# Patient Record
Sex: Male | Born: 1990 | Race: Black or African American | Hispanic: No | Marital: Single | Smoking: Current every day smoker
Health system: Southern US, Community
[De-identification: ages and names within clinical notes are randomized; demographics above are authoritative.]

---
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
## 2013-04-05 ENCOUNTER — Emergency Department (INDEPENDENT_AMBULATORY_CARE_PROVIDER_SITE_OTHER)
Admission: EM | Admit: 2013-04-05 | Discharge: 2013-04-05 | Disposition: A | Discharge status: Home / Self Care | Source: Home / Self Care | Attending: Family Medicine | Admitting: Family Medicine

## 2013-04-05 ENCOUNTER — Encounter (HOSPITAL_COMMUNITY): Payer: Self-pay | Admitting: Emergency Medicine

## 2013-04-05 DIAGNOSIS — B9789 Other viral agents as the cause of diseases classified elsewhere: Principal | ICD-10-CM

## 2013-04-05 DIAGNOSIS — J069 Acute upper respiratory infection, unspecified: Secondary | ICD-10-CM

## 2013-04-05 MED ORDER — GUAIFENESIN-CODEINE 100-10 MG/5ML PO SOLN: 5.0000 mL | Freq: Every evening | ORAL | Status: DC | PRN

## 2013-04-05 MED ORDER — IPRATROPIUM BROMIDE 0.06 % NA SOLN: 2.0000 | Freq: Four times a day (QID) | NASAL | Status: DC

## 2013-04-05 NOTE — ED Provider Notes (Signed)
Clyde CanterburyBrandon L Mackert is a 23 y.o. male who presents to Urgent Care today for 3 days of cough fever body aches and nasal congestion and discharge. Patient has tried multiple over-the-counter medications which helped. No shortness of breath nausea vomiting or diarrhea. No significant sick contacts. Patient feels well otherwise.   No past medical history on file. History  Substance Use Topics  . Smoking status: Current Every Day Smoker -- 0.10 packs/day    Types: Cigarettes  . Smokeless tobacco: Not on file  . Alcohol Use: No   ROS as above Medications reviewed. No current facility-administered medications for this encounter.   Current Outpatient Prescriptions  Medication Sig Dispense Refill  . ibuprofen (ADVIL,MOTRIN) 200 MG tablet Take 600 mg by mouth every 6 (six) hours as needed for fever.      Marland Kitchen. guaiFENesin-codeine 100-10 MG/5ML syrup Take 5 mLs by mouth at bedtime as needed for cough.  120 mL  0  . ipratropium (ATROVENT) 0.06 % nasal spray Place 2 sprays into both nostrils 4 (four) times daily.  15 mL  1    Exam:  BP 125/78  Pulse 96  Temp(Src) 99.3 F (37.4 C) (Oral)  Resp 18  SpO2 97% Gen: Well NAD HEENT: EOMI,  MMM posterior pharynx mild cobblestoning. Tympanic membranes are normal appearing bilaterally Lungs: Normal work of breathing. CTABL Heart: RRR no MRG Abd: NABS, Soft. NT, ND Exts: Non edematous BL  LE, warm and well perfused.    Assessment and Plan: 23 y.o. male with viral URI with cough versus influenza-like illness . Doubtful for pneumonia given normal oxygen saturation and lung findings on exam.  Plan to treat symptomatically with codeine containing cough medication and Atrovent nasal spray. Followup with primary care provider if not improving. ,Discussed warning signs or symptoms. Please see discharge instructions. Patient expresses understanding.      Rodolph BongEvan S Cosette Prindle, MD 04/05/13 2002

## 2013-04-05 NOTE — Discharge Instructions (Signed)
Thank you for coming in today. Take containing cough medication at bedtime as needed. Use Atrovent nasal spray for running nose. Use 2 Aleve twice daily for pain fevers chills bodyaches. Call or go to the emergency room if you get worse, have trouble breathing, have chest pains, or palpitations.

## 2013-04-05 NOTE — ED Notes (Signed)
C/o cough, runny nose, aching all over and fever onset 4-5 days ago.  Temp 101.5 this AM.

## 2019-02-23 ENCOUNTER — Ambulatory Visit (HOSPITAL_COMMUNITY)
Admission: EM | Admit: 2019-02-23 | Discharge: 2019-02-23 | Disposition: A | Discharge status: Home / Self Care | Payer: Managed Care, Other (non HMO) | Attending: Family Medicine | Admitting: Family Medicine

## 2019-02-23 ENCOUNTER — Encounter (HOSPITAL_COMMUNITY): Payer: Self-pay | Admitting: Emergency Medicine

## 2019-02-23 ENCOUNTER — Other Ambulatory Visit: Payer: Self-pay

## 2019-02-23 DIAGNOSIS — R05 Cough: Secondary | ICD-10-CM | POA: Insufficient documentation

## 2019-02-23 DIAGNOSIS — R059 Cough, unspecified: Secondary | ICD-10-CM

## 2019-02-23 DIAGNOSIS — Z20822 Contact with and (suspected) exposure to covid-19: Secondary | ICD-10-CM

## 2019-02-23 DIAGNOSIS — Z20828 Contact with and (suspected) exposure to other viral communicable diseases: Secondary | ICD-10-CM | POA: Diagnosis present

## 2019-02-23 NOTE — Discharge Instructions (Signed)
Get plenty of rest Tylenol for pain or fever Over-the-counter cough and cold medicines as needed Your test results are available in MyChart

## 2019-02-23 NOTE — ED Triage Notes (Signed)
Patient started with sore throat, cough, soreness last night.    Patient has a son that was with him for 4 days.  Child is back with mother, but mother has tested positive for covid

## 2019-02-24 NOTE — ED Provider Notes (Signed)
[place]    CSN: [id number] Arrival date & time: 02/23/19  1857      History   Chief Complaint Chief Complaint  Patient presents with  . Cough    HPI DAXTEN KOVALENKO is a 28 y.o. male.   HPI  Patient is here requesting coronavirus testing.  He states that he has a child that lives with his mother, but visits him on weekends.  He saw the child last weekend.  He states the child's mother called to say that she is positive for coronavirus.  The child was well and remains without symptoms.  He started with a sore throat last night.  He is here with his wife and second child all requesting coronavirus testing.  No fever chills.  No body aches.  No fatigue.  No loss of taste or smell.  No known exposure to coronavirus other than child, he does work from home and tries to social distance He does have a history of cigarette smoking and is obese, potentially complicating coronavirus if he should develop this History reviewed. No pertinent past medical history.  There are no active problems to display for this patient.   History reviewed. No pertinent surgical history.     Home Medications    Prior to Admission medications   Medication Sig Start Date End Date Taking? Authorizing Provider  ipratropium (ATROVENT) 0.06 % nasal spray Place 2 sprays into both nostrils 4 (four) times daily. 04/05/13 02/23/19  Gregor Hams, MD    Family History History reviewed. No pertinent family history.  Social History Social History   Tobacco Use  . Smoking status: Current Every Day Smoker    Packs/day: 0.10    Types: Cigarettes  Substance Use Topics  . Alcohol use: Yes  . Drug use: No     Allergies   Patient has no known allergies.   Review of Systems Review of Systems  Constitutional: Negative for chills and fever.  HENT: Positive for sore throat. Negative for ear pain.   Eyes: Negative for pain and visual disturbance.  Respiratory: Positive for cough. Negative for  shortness of breath.   Cardiovascular: Negative for chest pain and palpitations.  Gastrointestinal: Negative for abdominal pain and vomiting.  Genitourinary: Negative for dysuria and hematuria.  Musculoskeletal: Negative for arthralgias and back pain.  Skin: Negative for color change and rash.  Neurological: Negative for seizures and syncope.  All other systems reviewed and are negative.    Physical Exam Triage Vital Signs ED Triage Vitals  Enc Vitals Group     BP 02/23/19 1952 (!) 148/72     Pulse Rate 02/23/19 1952 90     Resp 02/23/19 1952 20     Temp 02/23/19 1952 99.5 F (37.5 C)     Temp Source 02/23/19 1952 Oral     SpO2 02/23/19 1952 97 %     Weight --      Height --      Head Circumference --      Peak Flow --      Pain Score 02/23/19 1948 6     Pain Loc --      Pain Edu? --      Excl. in [place]? --    No data found.  Updated Vital Signs BP (!) 148/72 (BP Location: Right Arm) Comment (BP Location): large cuff  Pulse 90   Temp 99.5 F (37.5 C) (Oral)   Resp 20   SpO2 97%  Physical Exam Constitutional:      General: He is not in acute distress.    Appearance: He is well-developed. He is obese.     Comments: Appears well.  HENT:     Head: Normocephalic and atraumatic.     Nose: Rhinorrhea present.     Mouth/Throat:     Pharynx: Posterior oropharyngeal erythema present.  Eyes:     Conjunctiva/sclera: Conjunctivae normal.     Pupils: Pupils are equal, round, and reactive to light.  Neck:     Musculoskeletal: Normal range of motion.  Cardiovascular:     Rate and Rhythm: Normal rate and regular rhythm.  Pulmonary:     Effort: Pulmonary effort is normal. No respiratory distress.  Abdominal:     General: There is no distension.     Palpations: Abdomen is soft.  Musculoskeletal: Normal range of motion.  Skin:    General: Skin is warm and dry.  Neurological:     Mental Status: He is alert.  Psychiatric:        Mood and Affect: Mood normal.         Behavior: Behavior normal.      UC Treatments / Results  Labs (all labs ordered are listed, but only abnormal results are displayed) Labs Reviewed  NOVEL CORONAVIRUS, NAA (HOSP ORDER, SEND-OUT TO REF LAB; TAT 18-24 HRS)    EKG   Radiology No results found.  Procedures Procedures (including critical care time)  Medications Ordered in UC Medications - No data to display  Initial Impression / Assessment and Plan / UC Course  I have reviewed the triage vital signs and the nursing notes.  Pertinent labs & imaging results that were available during my care of the patient were reviewed by me and considered in my medical decision making (see chart for details).     Reviewed coronavirus with family.  Need for quarantine.  Test result availability.  Call for problems Final Clinical Impressions(s) / UC Diagnoses   Final diagnoses:  Cough  Encounter for screening laboratory testing for COVID-19 virus     Discharge Instructions     Get plenty of rest Tylenol for pain or fever Over-the-counter cough and cold medicines as needed Your test results are available in MyChart   ED Prescriptions    None     PDMP not reviewed this encounter.   Eustace Moore, MD 02/24/19 [phone]

## 2019-02-25 LAB — NOVEL CORONAVIRUS, NAA (HOSP ORDER, SEND-OUT TO REF LAB; TAT 18-24 HRS): SARS-CoV-2, NAA: NOT DETECTED

## 2019-05-12 ENCOUNTER — Other Ambulatory Visit: Payer: Self-pay | Admitting: Internal Medicine

## 2019-05-12 ENCOUNTER — Ambulatory Visit
Admission: RE | Admit: 2019-05-12 | Discharge: 2019-05-12 | Disposition: A | Discharge status: Home / Self Care | Payer: Managed Care, Other (non HMO) | Source: Ambulatory Visit | Attending: Internal Medicine | Admitting: Internal Medicine

## 2019-05-12 DIAGNOSIS — M25512 Pain in left shoulder: Secondary | ICD-10-CM

## 2020-12-01 IMAGING — CR DG SHOULDER 2+V*L*
3 series · 3 of 3 positions shown · non-contrast
Comparison: None.

CLINICAL DATA: Left shoulder pain with unspecified chronicity,
decreased range of motion

EXAM:
LEFT SHOULDER - 2+ VIEW

[w shoulder ap internal left]
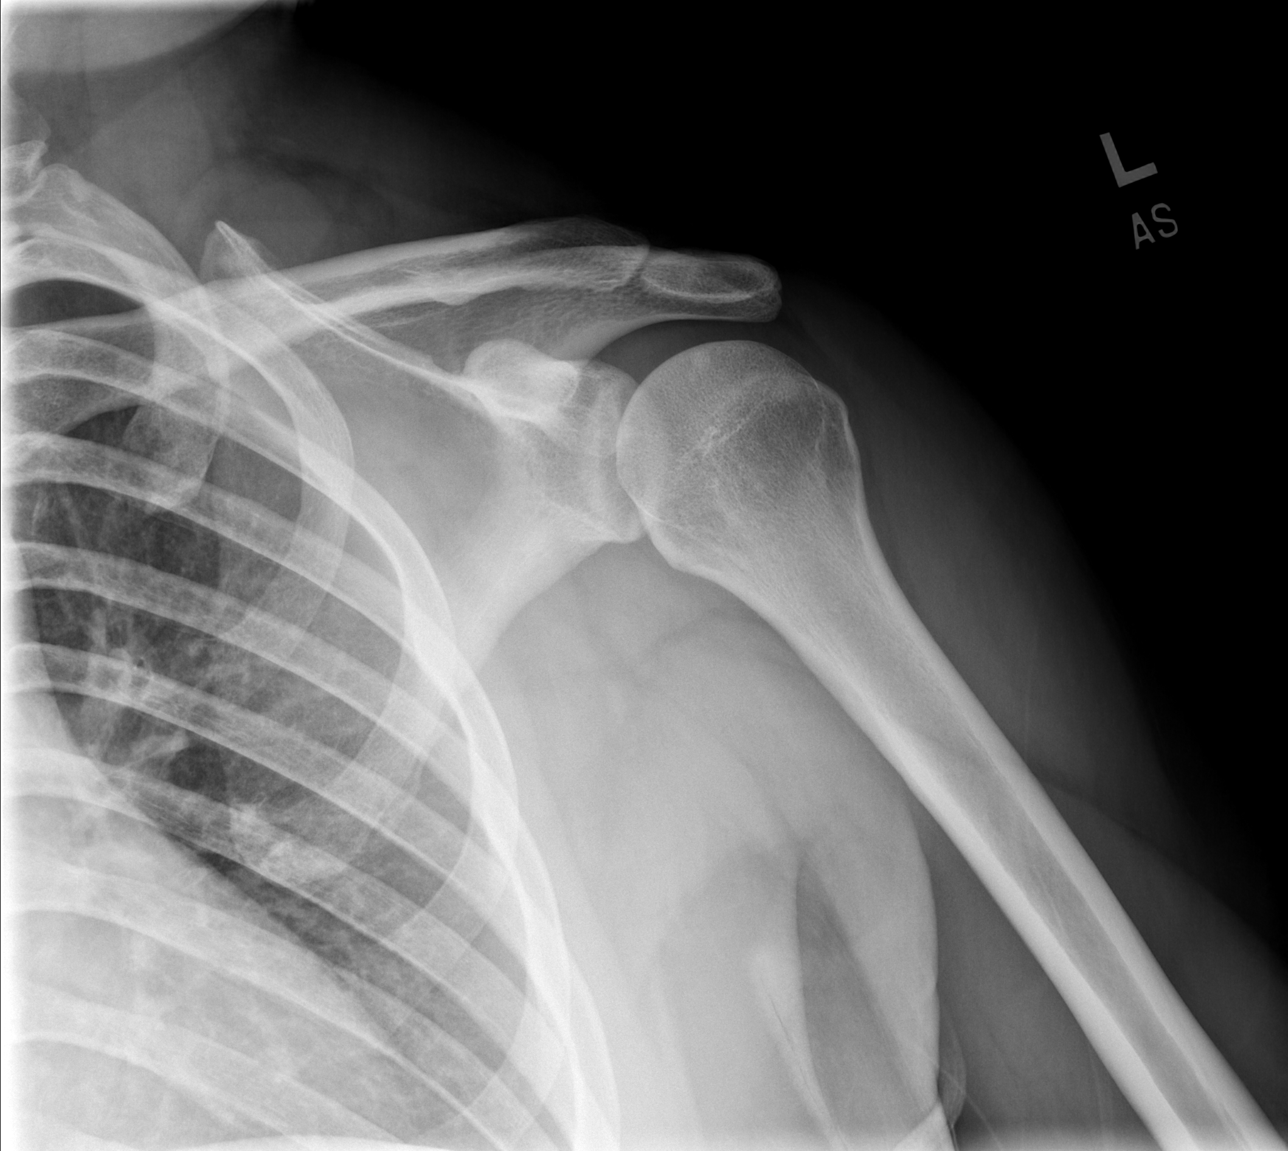

[w shoulder y view left *]
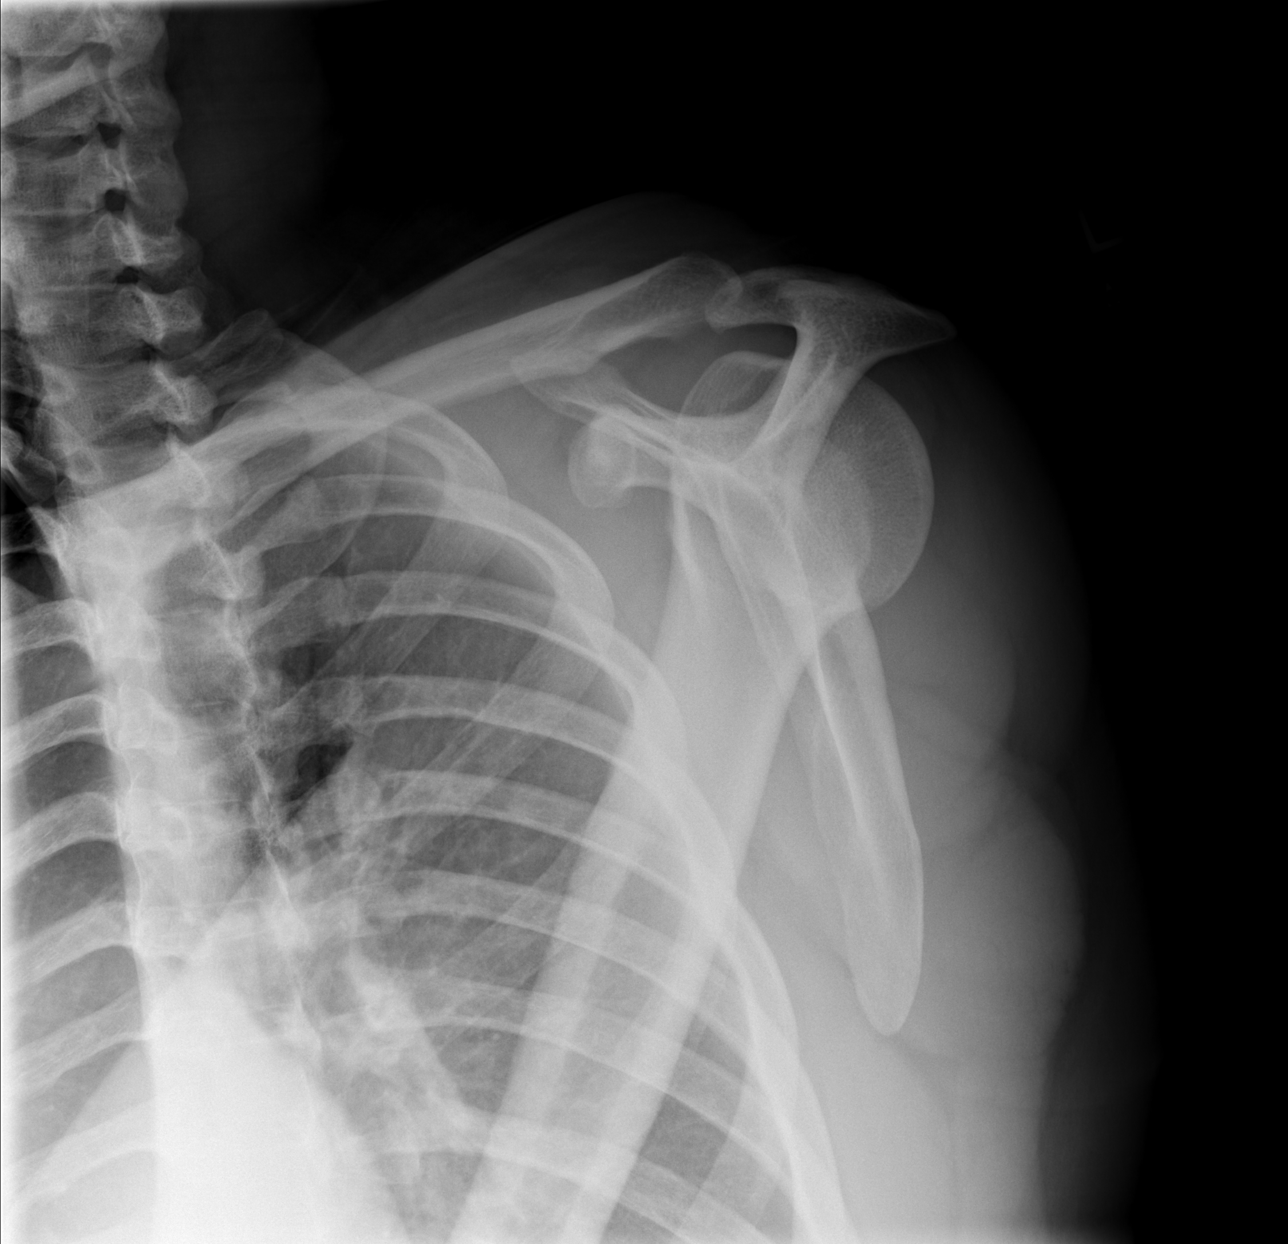

[w shoulder axillary left *]
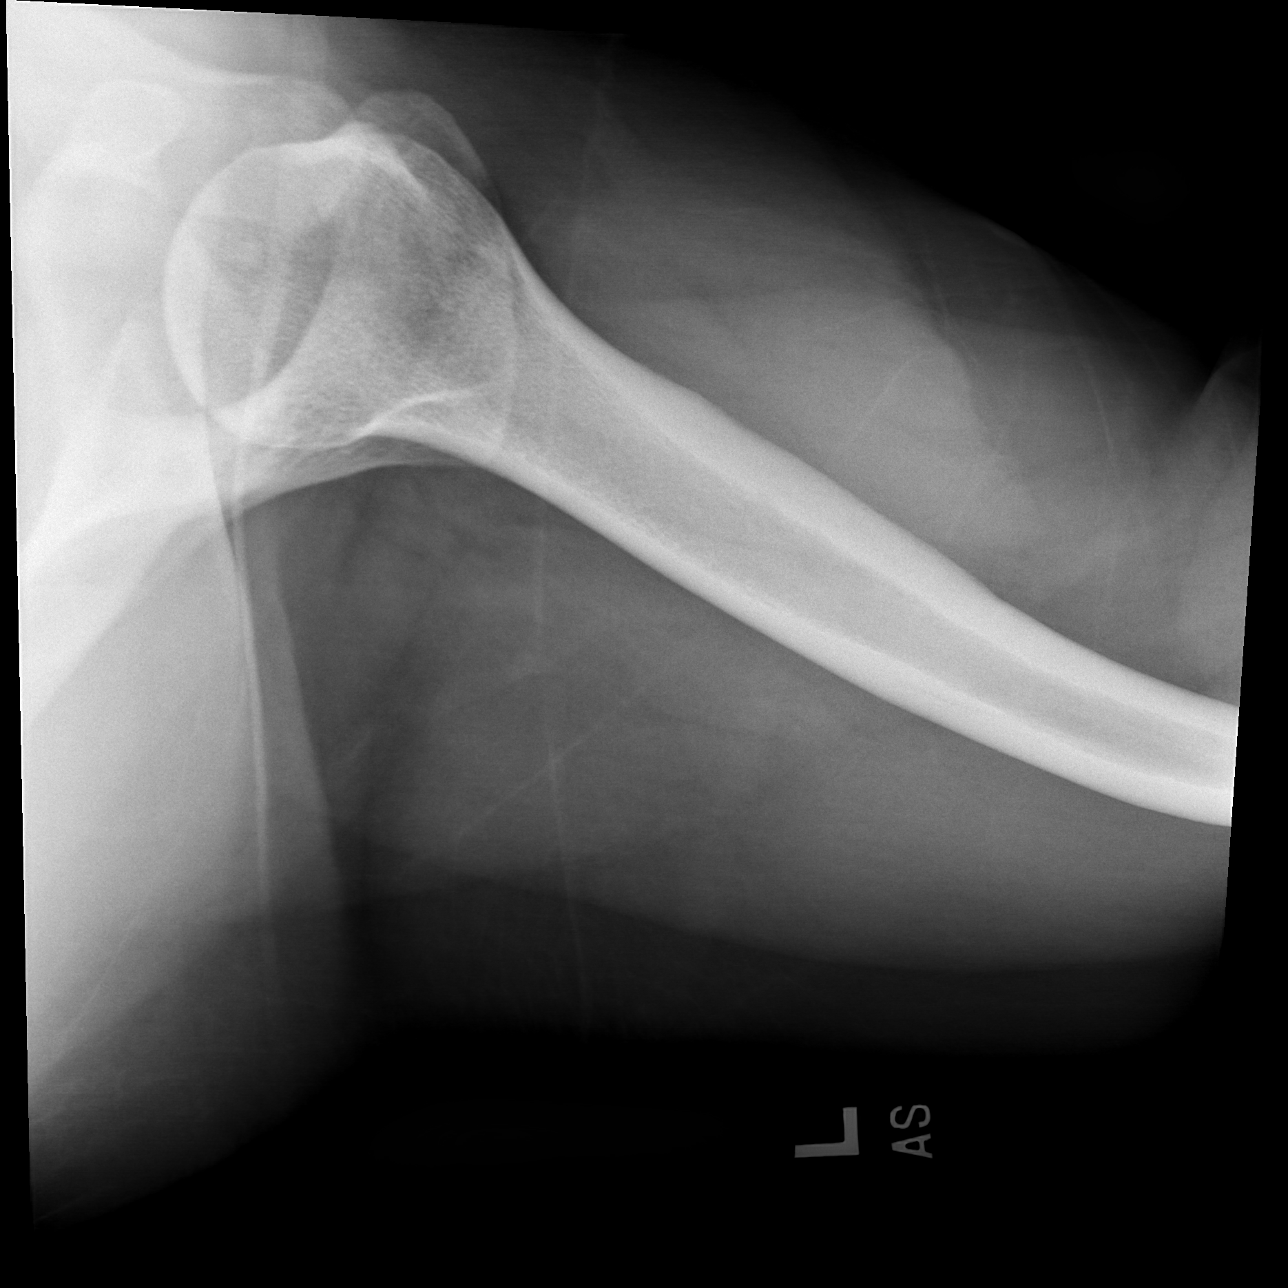

[3 of 3 positions shown; findings below may reference images not displayed]

FINDINGS: No acute bony abnormality. Specifically, no fracture, subluxation,
or dislocation. Benign bone island in the humeral head. Soft tissues
are unremarkable. Included portion of the left chest wall is free of
acute abnormality.
IMPRESSION: Negative exam.

## 2022-12-02 ENCOUNTER — Other Ambulatory Visit: Payer: Self-pay

## 2022-12-02 ENCOUNTER — Encounter (HOSPITAL_COMMUNITY): Payer: Self-pay | Admitting: *Deleted

## 2022-12-02 ENCOUNTER — Ambulatory Visit (HOSPITAL_COMMUNITY)
Admission: EM | Admit: 2022-12-02 | Discharge: 2022-12-02 | Disposition: A | Discharge status: Home / Self Care | Payer: Managed Care, Other (non HMO) | Attending: Emergency Medicine | Admitting: Emergency Medicine

## 2022-12-02 DIAGNOSIS — H5712 Ocular pain, left eye: Secondary | ICD-10-CM | POA: Diagnosis not present

## 2022-12-02 DIAGNOSIS — S0502XA Injury of conjunctiva and corneal abrasion without foreign body, left eye, initial encounter: Secondary | ICD-10-CM

## 2022-12-02 MED ORDER — FLUORESCEIN SODIUM 1 MG OP STRP
ORAL_STRIP | OPHTHALMIC | Status: AC
  Filled 2022-12-02: qty 1

## 2022-12-02 MED ORDER — EYE WASH OP SOLN
OPHTHALMIC | Status: AC
  Filled 2022-12-02: qty 118

## 2022-12-02 MED ORDER — TETRACAINE HCL 0.5 % OP SOLN
OPHTHALMIC | Status: AC
  Filled 2022-12-02: qty 4

## 2022-12-02 MED ORDER — OFLOXACIN 0.3 % OP SOLN: 1.0000 [drp] | Freq: Four times a day (QID) | OPHTHALMIC | 0 refills | Status: AC

## 2022-12-02 MED ORDER — ERYTHROMYCIN 5 MG/GM OP OINT: TOPICAL_OINTMENT | OPHTHALMIC | 0 refills | Status: AC

## 2022-12-02 MED ORDER — IBUPROFEN 600 MG PO TABS: 600.0000 mg | ORAL_TABLET | Freq: Four times a day (QID) | ORAL | 0 refills | Status: AC | PRN

## 2022-12-02 NOTE — ED Triage Notes (Signed)
Pt reports waking with pain to Lt eye with no known injury. Pain 8/10 with light sensitivity.

## 2022-12-02 NOTE — Discharge Instructions (Signed)
Use medications as prescribed.  Place drops in first then apply ointment 1 minute later If pain does not improve. You may call Dr. Laban Emperor office at the above number. If pain worsens go to the ER.

## 2022-12-02 NOTE — ED Provider Notes (Addendum)
[facility]    CSN: [id number] Arrival date & time: 12/02/22  1146      History   Chief Complaint Chief Complaint  Patient presents with   Eye Pain    HPI Stephen Monroe is a 32 y.o. male.  Patient reports waking up yesterday morning with left eye pain pain 8 out of 10.  He denies any discharge to that eye.  There is no injury or trauma to the eye.   Eye Pain    History reviewed. No pertinent past medical history.  There are no problems to display for this patient.   History reviewed. No pertinent surgical history.     Home Medications    Prior to Admission medications   Medication Sig Start Date End Date Taking? Authorizing Provider  erythromycin ophthalmic ointment Place a 1/2 inch ribbon of ointment into the lower eyelid.  Apply after eyedrop 12/02/22 12/09/22 Yes Hershal Eriksson, [place] Gillis, NP  ibuprofen (ADVIL) 600 MG tablet Take 1 tablet (600 mg total) by mouth every 6 (six) hours as needed for moderate pain. Take with food 12/02/22  Yes Nicolas Banh, [place]is, NP  ofloxacin (OCUFLOX) 0.3 % ophthalmic solution Place 1 drop into the left eye 4 (four) times daily for 7 days. Use prior to ointment 12/02/22 12/09/22 Yes Whitney Bingaman, [place] Gillis, NP  ipratropium (ATROVENT) 0.06 % nasal spray Place 2 sprays into both nostrils 4 (four) times daily. 04/05/13 02/23/19  Rodolph Bong, MD    Family History History reviewed. No pertinent family history.  Social History Social History   Tobacco Use   Smoking status: Every Day    Current packs/day: 0.10    Types: Cigarettes  Substance Use Topics   Alcohol use: Yes   Drug use: No     Allergies   Patient has no known allergies.   Review of Systems Review of Systems  Eyes:  Positive for pain.     Physical Exam Triage Vital Signs ED Triage Vitals  Encounter Vitals Group     BP 12/02/22 1306 130/82     Systolic BP Percentile --      Diastolic BP Percentile --      Pulse Rate 12/02/22 1306 66     Resp 12/02/22 1306 20      Temp 12/02/22 1306 98.6 F (37 C)     Temp src --      SpO2 12/02/22 1306 97 %     Weight --      Height --      Head Circumference --      Peak Flow --      Pain Score 12/02/22 1304 8     Pain Loc --      Pain Education --      Exclude from Growth Chart --    No data found.  Updated Vital Signs BP 130/82   Pulse 66   Temp 98.6 F (37 C)   Resp 20   SpO2 97%   Visual Acuity Right Eye Distance:   Left Eye Distance:   Bilateral Distance:    Right Eye Near:   Left Eye Near:    Bilateral Near:     Physical Exam Vitals and nursing note reviewed.  Eyes:     General:        Left eye: Discharge present.    Extraocular Movements: Extraocular movements intact.     Pupils: Pupils are equal, round, and reactive to light.     Comments: Watery  discharge.  Pain 8 out of 10 with eye movement.  Photosensitive.  Neurological:     General: No focal deficit present.      UC Treatments / Results  Labs (all labs ordered are listed, but only abnormal results are displayed) Labs Reviewed - No data to display  EKG   Radiology No results found.  Procedures Procedures (including critical care time)  Medications Ordered in UC Medications - No data to display  Initial Impression / Assessment and Plan / UC Course  I have reviewed the triage vital signs and the nursing notes.  Pertinent labs & imaging results that were available during my care of the patient were reviewed by me and considered in my medical decision making (see chart for details).  Patient was evaluated.  Fluorescein stain applied to left eye with tetracaine eye rinsed after procedure Patient was noted to have corneal abrasion at the 3 o'clock position.  He does have tenderness on palpation of the upper and lower eye socket.  He has light sensitivity and increased pain on eye movement.  He denies any trauma to the eye and denies any events that may have caused a foreign substance to be present. Dr. Vanessa Barbara was  consulted.  Symptoms were reviewed instructions were received for  OFloxin 4 times daily erythromycin ointment 4 times daily Motrin for pain.  Patient to follow-up with Dr. Vanessa Barbara in office if pain does not resolve.  Reviewed red flag symptoms with patient.  Will discharge patient home with the understanding that if pain increases fever or any other worsening of symptoms occur that they are to be seen at the ER or contact Dr. Vanessa Barbara immediately.  Final Clinical Impressions(s) / UC Diagnoses   Final diagnoses:  Abrasion of left cornea, initial encounter  Left eye pain     Discharge Instructions      Use medications as prescribed.  Place drops in first then apply ointment 1 minute later If pain does not improve. You may call Dr. Laban Emperor office at the above number. If pain worsens go to the ER.      ED Prescriptions     Medication Sig Dispense Auth. Provider   ofloxacin (OCUFLOX) 0.3 % ophthalmic solution Place 1 drop into the left eye 4 (four) times daily for 7 days. Use prior to ointment 1.4 mL Ioan Landini M, NP   erythromycin ophthalmic ointment Place a 1/2 inch ribbon of ointment into the lower eyelid.  Apply after eyedrop 3.5 g Willie Loy M, NP   ibuprofen (ADVIL) 600 MG tablet Take 1 tablet (600 mg total) by mouth every 6 (six) hours as needed for moderate pain. Take with food 30 tablet Elliotte Marsalis, [place] [facility]is, NP      PDMP not reviewed this encounter.   Nelda Marseille, NP 12/02/22 1440    Nelda Marseille, NP 12/02/22 1442
